# Patient Record
Sex: Female | Born: 1990 | Race: Black or African American | Hispanic: No | Marital: Single | Smoking: Former smoker
Health system: Southern US, Community
[De-identification: ages and names within clinical notes are randomized; demographics above are authoritative.]

## PROBLEM LIST (undated history)

## (undated) DIAGNOSIS — Z789 Other specified health status: Secondary | ICD-10-CM

## (undated) HISTORY — DX: Other specified health status: Z78.9

---
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
## 2010-05-19 ENCOUNTER — Inpatient Hospital Stay (HOSPITAL_COMMUNITY): Admission: AD | Admit: 2010-05-19 | Discharge: 2010-05-19 | Payer: Self-pay | Admitting: Obstetrics & Gynecology

## 2010-06-07 ENCOUNTER — Ambulatory Visit (HOSPITAL_COMMUNITY): Admission: RE | Admit: 2010-06-07 | Discharge: 2010-06-07 | Payer: Self-pay | Admitting: Family Medicine

## 2010-06-21 ENCOUNTER — Ambulatory Visit (HOSPITAL_COMMUNITY): Admission: RE | Admit: 2010-06-21 | Discharge: 2010-06-21 | Payer: Self-pay | Admitting: Family Medicine

## 2010-08-02 ENCOUNTER — Ambulatory Visit: Payer: Self-pay | Admitting: Advanced Practice Midwife

## 2010-08-04 ENCOUNTER — Inpatient Hospital Stay (HOSPITAL_COMMUNITY): Admission: AD | Admit: 2010-08-04 | Discharge: 2010-08-04 | Payer: Self-pay | Admitting: Obstetrics & Gynecology

## 2010-08-04 ENCOUNTER — Inpatient Hospital Stay (HOSPITAL_COMMUNITY): Admission: AD | Admit: 2010-08-04 | Discharge: 2010-08-06 | Payer: Self-pay | Admitting: Obstetrics & Gynecology

## 2010-08-04 ENCOUNTER — Ambulatory Visit: Payer: Self-pay | Admitting: Obstetrics and Gynecology

## 2010-11-28 ENCOUNTER — Inpatient Hospital Stay (HOSPITAL_COMMUNITY): Admission: AD | Admit: 2010-11-28 | Discharge: 2010-08-02 | Payer: Self-pay | Admitting: Obstetrics and Gynecology

## 2011-03-07 LAB — CBC
Hemoglobin: 12.9 g/dL (ref 12.0–15.0)
MCH: 30.7 pg (ref 26.0–34.0)
MCHC: 33.8 g/dL (ref 30.0–36.0)
MCV: 90.7 fL (ref 78.0–100.0)
RBC: 4.21 MIL/uL (ref 3.87–5.11)
WBC: 16.4 10*3/uL — ABNORMAL HIGH (ref 4.0–10.5)

## 2011-03-07 LAB — RPR: RPR Ser Ql: NONREACTIVE

## 2011-03-10 LAB — GC/CHLAMYDIA PROBE AMP, GENITAL
Chlamydia, DNA Probe: NEGATIVE
GC Probe Amp, Genital: NEGATIVE

## 2011-03-10 LAB — URINALYSIS, ROUTINE W REFLEX MICROSCOPIC
Ketones, ur: NEGATIVE mg/dL
Nitrite: NEGATIVE
Protein, ur: NEGATIVE mg/dL

## 2011-03-10 LAB — WET PREP, GENITAL

## 2011-03-10 LAB — URINE CULTURE

## 2011-03-10 LAB — RAPID URINE DRUG SCREEN, HOSP PERFORMED: Cocaine: NOT DETECTED

## 2011-03-10 LAB — FETAL FIBRONECTIN: Fetal Fibronectin: POSITIVE — AB

## 2017-06-19 ENCOUNTER — Encounter (HOSPITAL_COMMUNITY): Payer: Self-pay | Admitting: Emergency Medicine

## 2017-06-19 ENCOUNTER — Ambulatory Visit (HOSPITAL_COMMUNITY)
Admission: EM | Admit: 2017-06-19 | Discharge: 2017-06-19 | Disposition: A | Discharge status: Home / Self Care | Payer: Self-pay | Attending: Family Medicine | Admitting: Family Medicine

## 2017-06-19 DIAGNOSIS — L249 Irritant contact dermatitis, unspecified cause: Secondary | ICD-10-CM

## 2017-06-19 MED ORDER — HYDROXYZINE HCL 25 MG PO TABS: 25.0000 mg | ORAL_TABLET | Freq: Four times a day (QID) | ORAL | 0 refills | Status: DC

## 2017-06-19 MED ORDER — PREDNISONE 5 MG PO TABS: ORAL_TABLET | ORAL | 0 refills | Status: DC

## 2017-06-19 NOTE — ED Provider Notes (Signed)
CSN: [id number]     Arrival date & time 06/19/17  1005 History   None    Chief Complaint  Patient presents with  . Rash   (Consider location/radiation/quality/duration/timing/severity/associated sxs/prior Treatment) Patient has rash on face that is itchy and red.  She is unaware of any irritants or allergens.   The history is provided by the patient.  Rash  Location:  Face Facial rash location:  Face Quality: itchiness and redness   Severity:  Mild Onset quality:  Sudden Duration:  2 days Timing:  Constant Progression:  Worsening Chronicity:  New Relieved by:  Nothing Worsened by:  Nothing Ineffective treatments:  None tried   History reviewed. No pertinent past medical history. History reviewed. No pertinent surgical history. History reviewed. No pertinent family history. Social History  Substance Use Topics  . Smoking status: Current Every Day Smoker    Packs/day: 1.00    Types: Cigarettes  . Smokeless tobacco: Never Used  . Alcohol use Yes   OB History    No data available     Review of Systems  Constitutional: Negative.   HENT: Negative.   Eyes: Negative.   Respiratory: Negative.   Cardiovascular: Negative.   Gastrointestinal: Negative.   Endocrine: Negative.   Genitourinary: Negative.   Musculoskeletal: Negative.   Skin: Positive for rash.  Allergic/Immunologic: Negative.   Neurological: Negative.   Hematological: Negative.   Psychiatric/Behavioral: Negative.     Allergies  Patient has no known allergies.  Home Medications   Prior to Admission medications   Medication Sig Start Date End Date Taking? Authorizing Provider  hydrOXYzine (ATARAX/VISTARIL) 25 MG tablet Take 1 tablet (25 mg total) by mouth every 6 (six) hours. 06/19/17   Deatra Canterxford, Upton Russey J, FNP  predniSONE (DELTASONE) 5 MG tablet Take 6-5-4-3-2-1 po qd 06/19/17   Deatra Canterxford, Donnelle Olmeda J, FNP   Meds Ordered and Administered this Visit  Medications - No data to display  BP 104/68 (BP  Location: Right Arm)   Pulse 64   Temp 98.4 F (36.9 C) (Oral)   Resp 16   LMP 06/12/2017   SpO2 98%  No data found.   Physical Exam  Constitutional: She appears well-developed and well-nourished.  HENT:  Head: Normocephalic and atraumatic.  Eyes: Conjunctivae and EOM are normal. Pupils are equal, round, and reactive to light.  Neck: Normal range of motion. Neck supple.  Cardiovascular: Normal rate, regular rhythm and normal heart sounds.   Pulmonary/Chest: Effort normal and breath sounds normal.  Skin: Rash noted.  Erythematous itchy rash on cheeks, nose, forehead.  Nursing note and vitals reviewed.   Urgent Care Course     Procedures (including critical care time)  Labs Review Labs Reviewed - No data to display  Imaging Review No results found.   Visual Acuity Review  Right Eye Distance:   Left Eye Distance:   Bilateral Distance:    Right Eye Near:   Left Eye Near:    Bilateral Near:         MDM   1. Irritant contact dermatitis, unspecified trigger    Prednisone 5mg  take 6-5-4-3-2-1 po qd #21 Hydroxyzine 25mg  one po q 6 hours prn #24      Deatra CanterOxford, Irlene Crudup J, FNP 06/19/17 1114

## 2017-06-19 NOTE — ED Triage Notes (Signed)
Pt c/o rash on face since yest   Voices no other concerns  Taking benadryl w/no relief  A&O x4... NAD... Ambulatory

## 2017-06-19 NOTE — ED Notes (Signed)
Bed: UC01 Expected date:  Expected time:  Means of arrival:  Comments: Closed 

## 2018-01-16 ENCOUNTER — Other Ambulatory Visit: Payer: Self-pay

## 2018-01-16 ENCOUNTER — Emergency Department
Admission: EM | Admit: 2018-01-16 | Discharge: 2018-01-16 | Disposition: A | Discharge status: Home / Self Care | Payer: Self-pay | Attending: Emergency Medicine | Admitting: Emergency Medicine

## 2018-01-16 ENCOUNTER — Encounter: Payer: Self-pay | Admitting: Emergency Medicine

## 2018-01-16 DIAGNOSIS — H9201 Otalgia, right ear: Secondary | ICD-10-CM | POA: Insufficient documentation

## 2018-01-16 DIAGNOSIS — F17210 Nicotine dependence, cigarettes, uncomplicated: Secondary | ICD-10-CM | POA: Insufficient documentation

## 2018-01-16 DIAGNOSIS — J029 Acute pharyngitis, unspecified: Secondary | ICD-10-CM | POA: Insufficient documentation

## 2018-01-16 LAB — GROUP A STREP BY PCR: Group A Strep by PCR: NOT DETECTED

## 2018-01-16 MED ORDER — CETIRIZINE HCL 10 MG PO CAPS: 10.0000 mg | ORAL_CAPSULE | Freq: Every day | ORAL | 3 refills | Status: AC

## 2018-01-16 MED ORDER — MAGIC MOUTHWASH W/LIDOCAINE: 10.0000 mL | Freq: Four times a day (QID) | ORAL | 0 refills | Status: DC | PRN

## 2018-01-16 NOTE — ED Notes (Signed)
NAD noted at time of D/C. Pt denies questions or concerns. Pt ambulatory to the lobby at this time.  

## 2018-01-16 NOTE — ED Provider Notes (Signed)
[facility] Emergency Department Provider Note  ____________________________________________  Time seen: Approximately 10:34 PM  I have reviewed the triage vital signs and the nursing notes.   HISTORY  Chief Complaint Sore Throat    HPI Deborah Bishop is a 27 y.o. female who presents to the emergency department for evaluation and treatment of sore throat.  Symptoms started yesterday.  She is also had some right ear pain.  She denies fever or cough.  She has eaten some cough drops, but otherwise has not attempted any alleviating measures for this complaint.  History reviewed. No pertinent past medical history.  There are no active problems to display for this patient.   History reviewed. No pertinent surgical history.  Prior to Admission medications   Medication Sig Start Date End Date Taking? Authorizing Provider  Cetirizine HCl 10 MG CAPS Take 1 capsule (10 mg total) by mouth daily. 01/16/18   Izaih Kataoka, Rulon Eisenmengerari B, FNP  hydrOXYzine (ATARAX/VISTARIL) 25 MG tablet Take 1 tablet (25 mg total) by mouth every 6 (six) hours. 06/19/17   Deatra Canterxford, William J, FNP  magic mouthwash w/lidocaine SOLN Take 10 mLs by mouth 4 (four) times daily as needed for mouth pain. 30ml diphenhydramine; 30ml Maalox; 20ml Lidocaine 01/16/18   Jaimi Belle, Rulon Eisenmengerari B, FNP  predniSONE (DELTASONE) 5 MG tablet Take 6-5-4-3-2-1 po qd 06/19/17   Deatra Canterxford, William J, FNP    Allergies Patient has no known allergies.  History reviewed. No pertinent family history.  Social History Social History   Tobacco Use  . Smoking status: Current Every Day Smoker    Packs/day: 1.00    Types: Cigarettes  . Smokeless tobacco: Never Used  Substance Use Topics  . Alcohol use: Yes  . Drug use: No    Review of Systems Constitutional: Negative for fever. Eyes: No visual changes. ENT: Positive for sore throat; negative for difficulty swallowing. Positive for right otalgia. Respiratory: Denies shortness of  breath. Gastrointestinal: No abdominal pain.  No nausea, no vomiting.  No diarrhea.  Genitourinary: Negative for dysuria. Musculoskeletal: Negative for generalized body aches. Skin: Negative for rash. Neurological: Negative for headaches, no  focal weakness or numbness.  ____________________________________________   PHYSICAL EXAM:  VITAL SIGNS: ED Triage Vitals [01/16/18 2023]  Enc Vitals Group     BP 109/70     Pulse Rate 100     Resp 16     Temp 98.6 F (37 C)     Temp Source Oral     SpO2 100 %     Weight 140 lb (63.5 kg)     Height 5\' 2"  (1.575 m)     Head Circumference      Peak Flow      Pain Score 10     Pain Loc      Pain Edu?      Excl. in GC?    Constitutional: Alert and oriented. Well appearing and in no acute distress. Eyes: Conjunctivae are normal.  Head: Atraumatic. Nose: No congestion/rhinnorhea. Mouth/Throat: Mucous membranes are moist.  Oropharynx erythematous, tonsils flat without exudate. Uvula is midline. Neck: No stridor.  Lymphatic: Lymphadenopathy: Bilateral anterior cervical lymphadenopathy. Cardiovascular: Normal rate, regular rhythm. Good peripheral circulation. Respiratory: Normal respiratory effort. Lungs CTAB. Gastrointestinal: Soft and nontender. Musculoskeletal: No lower extremity tenderness nor edema.  Neurologic:  Normal speech and language. No gross focal neurologic deficits are appreciated. Speech is normal. No gait instability. Skin:  Skin is warm, dry and intact. No rash noted Psychiatric: Mood and affect are  normal. Speech and behavior are normal.  ____________________________________________   LABS (all labs ordered are listed, but only abnormal results are displayed)  Labs Reviewed  GROUP A STREP BY PCR   ____________________________________________  EKG  Not indicated. ____________________________________________  RADIOLOGY  Not  indicated. ____________________________________________   PROCEDURES  Procedure(s) performed: None  Critical Care performed: No ____________________________________________   INITIAL IMPRESSION / ASSESSMENT AND PLAN / ED COURSE  27 year old female presenting to the emergency department for evaluation of sore throat.  Strep screen is negative.  She will be treated with Magic mouthwash and cetirizine.  She was encouraged to establish primary care and follow-up if not improving over the next few days.  She was instructed to return to the emergency department for symptoms of change or worsen if she is unable to schedule an appointment.  Pertinent labs & imaging results that were available during my care of the patient were reviewed by me and considered in my medical decision making (see chart for details). ____________________________________________  Discharge Medication List as of 01/16/2018  9:41 PM    START taking these medications   Details  Cetirizine HCl 10 MG CAPS Take 1 capsule (10 mg total) by mouth daily., Starting Sat 01/16/2018, Print    magic mouthwash w/lidocaine SOLN Take 10 mLs by mouth 4 (four) times daily as needed for mouth pain. 30ml diphenhydramine; 30ml Maalox; 20ml Lidocaine, Starting Sat 01/16/2018, Print        FINAL CLINICAL IMPRESSION(S) / ED DIAGNOSES  Final diagnoses:  Viral pharyngitis    If controlled substance prescribed during this visit, 12 month history viewed on the [facility] prior to issuing an initial prescription for Schedule II or III opiod.   Note:  This document was prepared using Dragon voice recognition software and may include unintentional dictation errors.    Chinita Pester, FNP 01/16/18 2239    Sharman Cheek, MD 01/16/18 2352

## 2018-01-16 NOTE — ED Triage Notes (Signed)
Pt states sore throat today. Pt appears in no acute distress, denies other symptoms than right sided ear pain with sore throat.

## 2020-01-25 ENCOUNTER — Encounter (HOSPITAL_COMMUNITY): Payer: Self-pay

## 2020-01-25 ENCOUNTER — Ambulatory Visit (HOSPITAL_COMMUNITY)
Admission: EM | Admit: 2020-01-25 | Discharge: 2020-01-25 | Disposition: A | Discharge status: Home / Self Care | Payer: Self-pay

## 2020-01-25 ENCOUNTER — Other Ambulatory Visit: Payer: Self-pay

## 2020-01-25 DIAGNOSIS — H6123 Impacted cerumen, bilateral: Secondary | ICD-10-CM

## 2020-01-25 NOTE — Discharge Instructions (Addendum)
We cleaned both of the ears out.  If this problem reoccurs you can try debrox OTC to soften the ear wax.  Follow up as needed for continued or worsening symptoms

## 2020-01-25 NOTE — ED Triage Notes (Signed)
Pt state she has ear pain since Monday. ( both ) ears

## 2020-01-26 NOTE — ED Provider Notes (Signed)
[facility]    CSN: [id number] Arrival date & time: 01/25/20  7829      History   Chief Complaint Chief Complaint  Patient presents with  . Otalgia    HPI Deborah Bishop is a 29 y.o. female.   Patient is a 29 year old female who presents today with bilateral ear fullness, decreased hearing.  Symptoms have been constant for the past 3 to 4 days.  Does not have any specific pain in the ears.  Denies any associated nasal congestion, rhinorrhea, cough, fevers.  Deborah Bishop has not done anything for her symptoms.  No foreign bodies or injuries to the ear .  ROS per HPI      History reviewed. No pertinent past medical history.  There are no problems to display for this patient.   History reviewed. No pertinent surgical history.  OB History   No obstetric history on file.      Home Medications    Prior to Admission medications   Medication Sig Start Date End Date Taking? Authorizing Provider  Cetirizine HCl 10 MG CAPS Take 1 capsule (10 mg total) by mouth daily. 01/16/18   Triplett, Rulon Eisenmenger B, FNP  hydrOXYzine (ATARAX/VISTARIL) 25 MG tablet Take 1 tablet (25 mg total) by mouth every 6 (six) hours. 06/19/17   Deatra Canter, FNP  magic mouthwash w/lidocaine SOLN Take 10 mLs by mouth 4 (four) times daily as needed for mouth pain. 79ml diphenhydramine; 6ml Maalox; 22ml Lidocaine 01/16/18   Kem Boroughs B, FNP  predniSONE (DELTASONE) 5 MG tablet Take 6-5-4-3-2-1 po qd 06/19/17   Deatra Canter, FNP    Family History History reviewed. No pertinent family history.  Social History Social History   Tobacco Use  . Smoking status: Current Every Day Smoker    Packs/day: 1.00    Types: Cigarettes  . Smokeless tobacco: Never Used  Substance Use Topics  . Alcohol use: Yes  . Drug use: No     Allergies   Patient has no known allergies.   Review of Systems Review of Systems   Physical Exam Triage Vital Signs ED Triage Vitals  Enc Vitals Group     BP 01/25/20  0853 116/72     Pulse Rate 01/25/20 0853 75     Resp 01/25/20 0853 16     Temp 01/25/20 0853 98.7 F (37.1 C)     Temp Source 01/25/20 0853 Oral     SpO2 01/25/20 0853 100 %     Weight 01/25/20 0851 160 lb 3.2 oz (72.7 kg)     Height --      Head Circumference --      Peak Flow --      Pain Score 01/25/20 0851 8     Pain Loc --      Pain Edu? --      Excl. in GC? --    No data found.  Updated Vital Signs BP 116/72 (BP Location: Right Arm)   Pulse 75   Temp 98.7 F (37.1 C) (Oral)   Resp 16   Wt 160 lb 3.2 oz (72.7 kg)   LMP 12/26/2019   SpO2 100%   BMI 29.30 kg/m   Visual Acuity Right Eye Distance:   Left Eye Distance:   Bilateral Distance:    Right Eye Near:   Left Eye Near:    Bilateral Near:     Physical Exam Vitals and nursing note reviewed.  Constitutional:      General: Deborah Bishop is  not in acute distress.    Appearance: Normal appearance. Deborah Bishop is not ill-appearing, toxic-appearing or diaphoretic.  HENT:     Head: Normocephalic.     Right Ear: There is impacted cerumen.     Left Ear: There is impacted cerumen.     Nose: Nose normal.  Eyes:     Conjunctiva/sclera: Conjunctivae normal.  Pulmonary:     Effort: Pulmonary effort is normal.  Musculoskeletal:        General: Normal range of motion.     Cervical back: Normal range of motion.  Skin:    General: Skin is warm and dry.     Findings: No rash.  Neurological:     Mental Status: Deborah Bishop is alert.  Psychiatric:        Mood and Affect: Mood normal.      UC Treatments / Results  Labs (all labs ordered are listed, but only abnormal results are displayed) Labs Reviewed - No data to display  EKG   Radiology No results found.  Procedures Procedures (including critical care time)  Medications Ordered in UC Medications - No data to display  Initial Impression / Assessment and Plan / UC Course  I have reviewed the triage vital signs and the nursing notes.  Pertinent labs & imaging results that  were available during my care of the patient were reviewed by me and considered in my medical decision making (see chart for details).     Bilateral cerumen impaction-ear wash done here in clinic with removal wax in both ears. Patient feels much better Recommended Debrox for future issues. No concern for infection at this time. Follow up as needed for continued or worsening symptoms  Final Clinical Impressions(s) / UC Diagnoses   Final diagnoses:  Bilateral impacted cerumen     Discharge Instructions     We cleaned both of the ears out.  If this problem reoccurs you can try debrox OTC to soften the ear wax.  Follow up as needed for continued or worsening symptoms     ED Prescriptions    None     PDMP not reviewed this encounter.   Loura Halt A, NP 01/26/20 [phone]

## 2020-05-11 ENCOUNTER — Encounter (HOSPITAL_COMMUNITY): Payer: Self-pay

## 2020-05-11 ENCOUNTER — Ambulatory Visit (HOSPITAL_COMMUNITY)
Admission: EM | Admit: 2020-05-11 | Discharge: 2020-05-11 | Disposition: A | Discharge status: Home / Self Care | Payer: BC Managed Care – PPO | Attending: Physician Assistant | Admitting: Physician Assistant

## 2020-05-11 ENCOUNTER — Other Ambulatory Visit: Payer: Self-pay

## 2020-05-11 DIAGNOSIS — S8992XA Unspecified injury of left lower leg, initial encounter: Secondary | ICD-10-CM

## 2020-05-11 DIAGNOSIS — M25562 Pain in left knee: Secondary | ICD-10-CM | POA: Diagnosis not present

## 2020-05-11 DIAGNOSIS — M25532 Pain in left wrist: Secondary | ICD-10-CM | POA: Diagnosis not present

## 2020-05-11 MED ORDER — IBUPROFEN 800 MG PO TABS: 800.0000 mg | ORAL_TABLET | Freq: Three times a day (TID) | ORAL | 0 refills | Status: DC

## 2020-05-11 NOTE — ED Provider Notes (Signed)
[facility]    CSN: [id number] Arrival date & time: 05/11/20  1819      History   Chief Complaint Chief Complaint  Patient presents with  . Wrist Pain  . Knee Pain    HPI Deborah Bishop is a 29 y.o. female.   Patient reports urgent care with 1 week history of left wrist pain and left knee pain.  She reports she has been in a self-defense class with work throughout the week.  She reports her wrist started bothering her on Monday.  She reports it started as a small twinge in the outside of the wrist.  She reports this is gotten a little worse throughout the week.  She reports she was doing a lot of planks when it started to bother her.  She reports it hurts to move the wrist at times.  Denies any numbness or tingling in the wrist.  Denies falling on the wrist.  She also reports left knee pain after kicking a bag and feeling a crack.  She reports immediately after feeling the pain and crack was difficult to stand on the knee.  She attempted twice and was successful in the third time.  She reports after this the knee hurt but was able to walk with a limp.  She reports since then she has had aching pain with some sharp shooting pain through the knee.  There was some swelling that is since gone down somewhat.  She has been taking it easy since then.  Denies previous injury to the knee     History reviewed. No pertinent past medical history.  There are no problems to display for this patient.   History reviewed. No pertinent surgical history.  OB History   No obstetric history on file.      Home Medications    Prior to Admission medications   Medication Sig Start Date End Date Taking? Authorizing Provider  Cetirizine HCl 10 MG CAPS Take 1 capsule (10 mg total) by mouth daily. 01/16/18  Yes Triplett, Cari B, FNP  hydrOXYzine (ATARAX/VISTARIL) 25 MG tablet Take 1 tablet (25 mg total) by mouth every 6 (six) hours. 06/19/17   Deatra Canter, FNP  ibuprofen (ADVIL) 800 MG  tablet Take 1 tablet (800 mg total) by mouth 3 (three) times daily. 05/11/20   Jahsir Rama, Veryl Speak, PA-C  magic mouthwash w/lidocaine SOLN Take 10 mLs by mouth 4 (four) times daily as needed for mouth pain. 49ml diphenhydramine; 21ml Maalox; 39ml Lidocaine 01/16/18   Kem Boroughs B, FNP  predniSONE (DELTASONE) 5 MG tablet Take 6-5-4-3-2-1 po qd 06/19/17   Deatra Canter, FNP    Family History History reviewed. No pertinent family history.  Social History Social History   Tobacco Use  . Smoking status: Current Every Day Smoker    Packs/day: 1.00    Types: Cigarettes  . Smokeless tobacco: Never Used  Substance Use Topics  . Alcohol use: Yes  . Drug use: No     Allergies   Patient has no known allergies.   Review of Systems Review of Systems   Physical Exam Triage Vital Signs ED Triage Vitals  Enc Vitals Group     BP 05/11/20 1916 105/70     Pulse Rate 05/11/20 1916 68     Resp 05/11/20 1916 18     Temp 05/11/20 1916 98.5 F (36.9 C)     Temp Source 05/11/20 1916 Oral     SpO2 05/11/20 1916 99 %  Weight --      Height --      Head Circumference --      Peak Flow --      Pain Score 05/11/20 1913 9     Pain Loc --      Pain Edu? --      Excl. in [place]? --    No data found.  Updated Vital Signs BP 105/70 (BP Location: Left Arm)   Pulse 68   Temp 98.5 F (36.9 C) (Oral)   Resp 18   LMP 04/25/2020   SpO2 99%   Visual Acuity Right Eye Distance:   Left Eye Distance:   Bilateral Distance:    Right Eye Near:   Left Eye Near:    Bilateral Near:     Physical Exam Vitals and nursing note reviewed.  Constitutional:      General: She is not in acute distress.    Appearance: She is well-developed.  HENT:     Head: Normocephalic and atraumatic.  Eyes:     Conjunctiva/sclera: Conjunctivae normal.  Cardiovascular:     Rate and Rhythm: Normal rate and regular rhythm.     Heart sounds: No murmur.  Pulmonary:     Effort: Pulmonary effort is normal. No  respiratory distress.     Breath sounds: Normal breath sounds.  Abdominal:     Palpations: Abdomen is soft.     Tenderness: There is no abdominal tenderness.  Musculoskeletal:     Right wrist: Normal.     Left wrist: Effusion and tenderness present. No swelling, deformity, bony tenderness, snuff box tenderness or crepitus. Decreased range of motion.     Cervical back: Neck supple.     Right knee: Normal.     Left knee: Effusion and bony tenderness present. No erythema or ecchymosis. Decreased range of motion.     Comments: There is tenderness at the medial and lateral joint lines.  There is pain elicited with valgus stress.  Anterior drawer and posterior drawer stable.  Skin:    General: Skin is warm and dry.  Neurological:     Mental Status: She is alert.      UC Treatments / Results  Labs (all labs ordered are listed, but only abnormal results are displayed) Labs Reviewed - No data to display  EKG   Radiology No results found.  Procedures Procedures (including critical care time)  Medications Ordered in UC Medications - No data to display  Initial Impression / Assessment and Plan / UC Course  I have reviewed the triage vital signs and the nursing notes.  Pertinent labs & imaging results that were available during my care of the patient were reviewed by me and considered in my medical decision making (see chart for details).     #Left knee injury #Left wrist pain Patient is 29 year old with acute injury to left knee.  Given exam suspect possible MCL injury or other ligamentous injury.  Suspect wrist is mild sprain/tendinitis.  Will place in knee brace and and crutches.  NSAID therapy with ice and rest.  We will have her follow-up with orthopedics for reevaluation of the knee.  Patient verbalized understanding plan. Final Clinical Impressions(s) / UC Diagnoses   Final diagnoses:  Injury of left knee, initial encounter  Left wrist pain  Acute pain of left knee      Discharge Instructions     I suspect you may have torn or injured a ligament in your knee  Wear the knee immobilizer  and use the crutches until you have had orthopedic evaluation  Take the ibuprofen and ice the knee.  This will also help with the wrist. You may also ice the wrist        ED Prescriptions    Medication Sig Dispense Auth. Provider   ibuprofen (ADVIL) 800 MG tablet Take 1 tablet (800 mg total) by mouth 3 (three) times daily. 21 tablet Denina Rieger, Veryl Speak, PA-C     PDMP not reviewed this encounter.   Hermelinda Medicus, PA-C 05/12/20 1021

## 2020-05-11 NOTE — ED Triage Notes (Signed)
Pt states she awoke Tuesday with left wrist pain. Denies injury/trauma to area.  Pt reports states while doing self-defense class, she heard a "crack" to her left knee when she kicked a high mat. Pt c/o pain left knee in general.  Has not taken any pain relievers/NSAIDs.

## 2020-05-11 NOTE — Discharge Instructions (Addendum)
I suspect you may have torn or injured a ligament in your knee  Wear the knee immobilizer and use the crutches until you have had orthopedic evaluation  Take the ibuprofen and ice the knee.  This will also help with the wrist. You may also ice the wrist

## 2021-02-19 HISTORY — PX: WISDOM TOOTH EXTRACTION: SHX21

## 2021-06-28 ENCOUNTER — Other Ambulatory Visit: Payer: Self-pay

## 2021-06-28 ENCOUNTER — Ambulatory Visit (INDEPENDENT_AMBULATORY_CARE_PROVIDER_SITE_OTHER): Payer: 59

## 2021-06-28 VITALS — BP 108/71 | HR 66 | Ht 62.0 in | Wt 132.0 lb

## 2021-06-28 DIAGNOSIS — Z3201 Encounter for pregnancy test, result positive: Secondary | ICD-10-CM

## 2021-06-28 DIAGNOSIS — Z3480 Encounter for supervision of other normal pregnancy, unspecified trimester: Secondary | ICD-10-CM | POA: Diagnosis not present

## 2021-06-28 LAB — POCT URINE PREGNANCY: Preg Test, Ur: POSITIVE — AB

## 2021-06-28 NOTE — Progress Notes (Addendum)
Deborah Bishop presents today for UPT. She has no unusual complaints.  LMP: 05/16/2021    OBJECTIVE: Appears well, in no apparent distress.  OB History     Gravida  2   Para  1   Term  1   Preterm      AB      Living  1      SAB      IAB      Ectopic      Multiple      Live Births  1          Home UPT Result:POSITIVE In-Office UPT result:POSITIVE  I have reviewed the patient's medical, obstetrical, social, and family histories, and medications.   ASSESSMENT: Positive pregnancy test LMP 05/16/2021 EDD 02/20/2022 [place]    [redacted]w[redacted]d  PLAN Prenatal care to be completed at: [facility]      Chart reviewed for nurse visit. Agree with plan of care.   Currie Paris, NP 06/28/2021 12:41 PM

## 2021-07-18 ENCOUNTER — Ambulatory Visit (INDEPENDENT_AMBULATORY_CARE_PROVIDER_SITE_OTHER): Payer: 59

## 2021-07-18 ENCOUNTER — Ambulatory Visit: Payer: 59

## 2021-07-18 ENCOUNTER — Other Ambulatory Visit: Payer: Self-pay

## 2021-07-18 VITALS — BP 96/58 | HR 60 | Ht 62.0 in | Wt 127.3 lb

## 2021-07-18 DIAGNOSIS — O3680X0 Pregnancy with inconclusive fetal viability, not applicable or unspecified: Secondary | ICD-10-CM

## 2021-07-18 DIAGNOSIS — Z3480 Encounter for supervision of other normal pregnancy, unspecified trimester: Secondary | ICD-10-CM

## 2021-07-18 DIAGNOSIS — Z3481 Encounter for supervision of other normal pregnancy, first trimester: Secondary | ICD-10-CM

## 2021-07-18 DIAGNOSIS — Z3491 Encounter for supervision of normal pregnancy, unspecified, first trimester: Secondary | ICD-10-CM

## 2021-07-18 DIAGNOSIS — Z3A08 8 weeks gestation of pregnancy: Secondary | ICD-10-CM

## 2021-07-18 MED ORDER — VITAFOL GUMMIES 3.33-0.333-34.8 MG PO CHEW: 3.0000 | CHEWABLE_TABLET | Freq: Every day | ORAL | 11 refills | Status: DC

## 2021-07-18 MED ORDER — DOXYLAMINE-PYRIDOXINE 10-10 MG PO TBEC: 2.0000 | DELAYED_RELEASE_TABLET | Freq: Every day | ORAL | 5 refills | Status: DC

## 2021-07-18 NOTE — Progress Notes (Signed)
New OB Intake  I connected with  Deborah Bishop on 07/18/21 at 10:15 AM EDT by in person Video Visit and verified that I am speaking with the correct person using two identifiers. Nurse is located at [facility] and pt is located at [place].  I discussed the limitations, risks, security and privacy concerns of performing an evaluation and management service by telephone and the availability of in person appointments. I also discussed with the patient that there may be a patient responsible charge related to this service. The patient expressed understanding and agreed to proceed.  I explained I am completing New OB Intake today. We discussed her EDD is undetermined at this time due to early gestation.Pt is G2/P1001. I reviewed her allergies, medications, Medical/Surgical/OB history, and appropriate screenings. I informed her of [facility] services. Based on history, this is a/an  pregnancy uncomplicated .   Patient Active Problem List   Diagnosis Date Noted   Supervision of other normal pregnancy, antepartum 06/28/2021    Concerns addressed today  Delivery Plans:  Plans to deliver at [facility].   MyChart/Babyscripts MyChart access verified. I explained pt will have some visits in office and some virtually. Babyscripts instructions given and order placed. Patient verifies receipt of registration text/e-mail. Account successfully created and app downloaded.  Blood Pressure Cuff  Patient has private insurance; instructed to purchase blood pressure cuff and bring to first prenatal appt. Explained after first prenatal appt pt will check weekly and document in Babyscripts.  Weight scale: Patient will purchase a weight scale.  Anatomy US Explained first scheduled [place] will be around 19 weeks. Dating and viability scan performed today.  Labs Discussed Avelina Laine genetic screening with patient. Would like both Panorama and Horizon drawn at new OB visit. Routine prenatal labs needed.  Covid Vaccine Patient has  covid vaccine.   Mother/ Baby Dyad Candidate?    If yes, offer as possibility  Informed patient of [facility] website  and placed link in her AVS.   Social Determinants of Health Food Insecurity: Patient denies food insecurity. WIC Referral: Patient is interested in referral to [facility].  Transportation: Patient denies transportation needs. Childcare: Discussed no children allowed at ultrasound appointments. Offered childcare services; patient declines childcare services at this time.   Placed OB Box on problem list and updated  First visit review I reviewed new OB appt with pt. I explained she will have a pelvic exam, ob bloodwork with genetic screening, and PAP smear. Explained pt will be seen by [name] at first visit; encounter routed to appropriate provider. Explained that patient will be seen by pregnancy navigator following visit with provider. [facility] information placed in AVS.   Hamilton Capri, RN 07/18/2021  10:11 AM

## 2021-08-01 ENCOUNTER — Other Ambulatory Visit: Payer: Self-pay

## 2021-08-01 ENCOUNTER — Ambulatory Visit (INDEPENDENT_AMBULATORY_CARE_PROVIDER_SITE_OTHER): Payer: 59 | Admitting: Obstetrics & Gynecology

## 2021-08-01 ENCOUNTER — Ambulatory Visit
Admission: RE | Admit: 2021-08-01 | Discharge: 2021-08-01 | Disposition: A | Discharge status: Home / Self Care | Payer: 59 | Source: Ambulatory Visit | Attending: Obstetrics & Gynecology | Admitting: Obstetrics & Gynecology

## 2021-08-01 DIAGNOSIS — O3680X0 Pregnancy with inconclusive fetal viability, not applicable or unspecified: Secondary | ICD-10-CM | POA: Diagnosis not present

## 2021-08-01 DIAGNOSIS — Z3491 Encounter for supervision of normal pregnancy, unspecified, first trimester: Secondary | ICD-10-CM | POA: Diagnosis present

## 2021-08-01 DIAGNOSIS — O021 Missed abortion: Secondary | ICD-10-CM | POA: Diagnosis not present

## 2021-08-01 NOTE — Progress Notes (Signed)
Pt waiting in lobby. Results are not back. Pt advised to come back to [facility] at 1pm for [place] results with provider. No available provider at this time.   Judeth Cornfield, RN

## 2021-08-01 NOTE — Progress Notes (Signed)
Patient ID: Deborah Bishop, female   DOB: February 28, 1991, 30 y.o.   MRN: [id number] Ultrasounds Results Note  SUBJECTIVE HPI:  Deborah Bishop is a 30 y.o. G2P1001 at [redacted]w[redacted]d by LMP who presents [facility] for followup ultrasound results. The patient denies abdominal pain or vaginal bleeding.  ultrasound was performed earlier today.   No past medical history on file. Past Surgical History:  Procedure Laterality Date   WISDOM TOOTH EXTRACTION  02/2021   Social History   Socioeconomic History   Marital status: Single    Spouse name: Not on file   Number of children: Not on file   Years of education: Not on file   Highest education level: Not on file  Occupational History   Not on file  Tobacco Use   Smoking status: Every Day    Packs/day: 0.50    Types: Cigarettes   Smokeless tobacco: Never  Vaping Use   Vaping Use: Never used  Substance and Sexual Activity   Alcohol use: Not Currently    Comment: not since confirmed   Drug use: No   Sexual activity: Yes    Partners: Male    Birth control/protection: None    Comment: currently pregnanct  Other Topics Concern   Not on file  Social History Narrative   Not on file   Social Determinants of Health   Financial Resource Strain: Not on file  Food Insecurity: Not on file  Transportation Needs: Not on file  Physical Activity: Not on file  Stress: Not on file  Social Connections: Not on file  Intimate Partner Violence: Not on file   Current Outpatient Medications on File Prior to Visit  Medication Sig Dispense Refill   Cetirizine HCl 10 MG CAPS Take 1 capsule (10 mg total) by mouth daily. 30 capsule 3   Doxylamine-Pyridoxine (DICLEGIS) 10-10 MG TBEC Take 2 tablets by mouth at bedtime. If symptoms persist, add one tablet in the morning and one in the afternoon 100 tablet 5   Prenatal Vit-Fe Phos-FA-Omega (VITAFOL GUMMIES) 3.33-0.333-34.8 MG CHEW Chew 3 tablets by mouth daily. 90 tablet 11   No current  facility-administered medications on file prior to visit.   No Known Allergies  I have reviewed patient's Past Medical Hx, Surgical Hx, Family Hx, Social Hx, medications and allergies.   Review of Systems Review of Systems  Constitutional: Negative for fever and chills.  Gastrointestinal: Negative for nausea, vomiting, abdominal pain, diarrhea and constipation.  Genitourinary: Negative for dysuria.  Musculoskeletal: Negative for back pain.  Neurological: Negative for dizziness and weakness.    Physical Exam  LMP 05/16/2021 (Exact Date)   GENERAL: Well-developed, well-nourished female in no acute distress.  HEENT: Normocephalic, atraumatic.   LUNGS: Effort normal ABDOMEN: soft, non-tender HEART: Regular rate  SKIN: Warm, dry and without erythema PSYCH: Normal mood and affect NEURO: Alert and oriented x 4, very tearful  LAB RESULTS No results found for this or any previous visit (from the past 24 hour(s)).  IMAGING US OB Limited  Result Date: 07/21/2021 ----------------------------------------------------------------------  OBSTETRICS REPORT                        (Signed Final 07/21/2021 09:59 pm) ---------------------------------------------------------------------- Patient Info  ID #:       [id number]                          D.O.B.:  02/02/1991 (30 yrs)  Name:       Deborah Bishop                   Visit Date: 07/18/2021 11:09 am ---------------------------------------------------------------------- Performed By  Attending:        Scheryl Darter MD        Ref. Address:      [address]                                                              [place], [place]                                                              [address]  Performed By:     Angelica Pou RN         [facility]:          [facility]                                                              [facility]  Referred By:      Adam Phenix                    MD ---------------------------------------------------------------------- Orders  #  Description                           Code        Ordered By  1  US OB LIMITED                         35573.2     Scheryl Darter ----------------------------------------------------------------------  #  Order #  Accession #                Episode #  1  [id number]                   [id number]                 [id number] ---------------------------------------------------------------------- Indications  Weeks of gestation of pregnancy not             Z3A.00  specified  Encounter for uncertain dates                   Z36.87 ---------------------------------------------------------------------- Fetal Evaluation  Num Of Fetuses:          1  Cardiac Activity:        Not visualized ---------------------------------------------------------------------- Biometry  GS:       16.1  mm     G. Age:  6w 4d                   EDD:    03/09/22  CRL:       2.9  mm     G. Age:  5w 6d                   EDD:    03/14/22 ---------------------------------------------------------------------- Comments  IUP identified with two possible YS. One possible FP  identified at [redacted]w[redacted]d by CRL. Recommend reapeat scan in two  week to confirm dates and rule out twin pregnancy.  Technically limited exam due to early Gestational Age. ---------------------------------------------------------------------- Impression  IUP with possibly 2 gestational sac, only one fetal pole, 5w  6d, FHM not detected ---------------------------------------------------------------------- Recommendations  Recommend 2 week repeat scan to confirm viability and  possible twin pregnancy ----------------------------------------------------------------------                  Scheryl Darter, MD Electronically Signed Final Report   07/21/2021 09:59 pm  ----------------------------------------------------------------------  US OB LESS THAN 14 WEEKS WITH OB TRANSVAGINAL  Result Date: 08/01/2021 CLINICAL DATA:  Dating and viability question twin pregnancy EXAM: OBSTETRIC <14 WK [place] AND TRANSVAGINAL OB US TECHNIQUE: Both transabdominal and transvaginal ultrasound examinations were performed for complete evaluation of the gestation as well as the maternal uterus, adnexal regions, and pelvic cul-de-sac. Transvaginal technique was performed to assess early pregnancy. COMPARISON:  07/18/2021 FINDINGS: Intrauterine gestational sac: Present, single, slightly irregular Yolk sac:  Present Embryo:  Present Cardiac Activity: Not identified Heart Rate: N/A  bpm MSD: 17.7 mm   6 w   5 d CRL:  2.9 mm   5 w   6 d                  [place] EDC: 03/28/2022 Subchorionic hemorrhage:  None visualized. Maternal uterus/adnexae: Uterus retroverted, otherwise unremarkable. RIGHT ovary measures 3.5 x 3.1 x 5.9 cm and contains a 3.4 cm diameter corpus luteal cyst. LEFT ovary normal size and morphology 3.6 x 2.2 x 2.7 cm. No free pelvic fluid or adnexal masses. IMPRESSION: Single intrauterine gestation identified. No fetal cardiac activity identified. Absent growth of fetal pole since the prior exam 14 days ago. Findings are consistent with failed pregnancy. Electronically Signed   By: Ulyses Southward M.D.   On: 08/01/2021 12:39    ASSESSMENT 1. Missed abortion with fetal demise before 20 completed weeks of gestation     PLAN  She requested expectant management rather than receive Cytotec or consider surgical completion and will schedule short term follow-up Discharge  home in stable condition Adam PhenixArnold, Rakeem Colley G, MD  08/07/2021  6:56 AM

## 2021-08-02 ENCOUNTER — Encounter: Payer: 59 | Admitting: Obstetrics and Gynecology

## 2021-08-08 ENCOUNTER — Other Ambulatory Visit: Payer: Self-pay

## 2021-08-08 ENCOUNTER — Ambulatory Visit (INDEPENDENT_AMBULATORY_CARE_PROVIDER_SITE_OTHER): Payer: 59 | Admitting: Obstetrics

## 2021-08-08 ENCOUNTER — Encounter: Payer: Self-pay | Admitting: Obstetrics

## 2021-08-08 VITALS — BP 129/77 | HR 109 | Ht 62.0 in | Wt 128.0 lb

## 2021-08-08 DIAGNOSIS — F4321 Adjustment disorder with depressed mood: Secondary | ICD-10-CM | POA: Diagnosis not present

## 2021-08-08 DIAGNOSIS — O039 Complete or unspecified spontaneous abortion without complication: Secondary | ICD-10-CM

## 2021-08-08 NOTE — Progress Notes (Signed)
Patient ID: Deborah Bishop, female   DOB: 01/19/1991, 30 y.o.   MRN: [id number]  Chief Complaint  Patient presents with   Follow-up    HPI Deborah Bishop is a 30 y.o. female.  Presents for follow up after being diagnosed with a blighted ovum on ultrasound done on 08-01-2021.with no history of vaginal bleeding and cramping at the time.  She requested expectant management.  She has had some vaginal bleeding with clots and cramping over the past week with less vaginal bleeding and no cramping now. HPI  History reviewed. No pertinent past medical history.  Past Surgical History:  Procedure Laterality Date   WISDOM TOOTH EXTRACTION  02/2021    Family History  Problem Relation Age of Onset   Stroke Father    Hypertension Father    Graves' disease Father    Ovarian cancer Maternal Grandmother    Pancreatic cancer Maternal Grandfather    Heart failure Paternal Grandmother     Social History Social History   Tobacco Use   Smoking status: Every Day    Packs/day: 0.50    Types: Cigarettes   Smokeless tobacco: Never  Vaping Use   Vaping Use: Never used  Substance Use Topics   Alcohol use: Not Currently    Comment: not since confirmed   Drug use: No    No Known Allergies  Current Outpatient Medications  Medication Sig Dispense Refill   Cetirizine HCl 10 MG CAPS Take 1 capsule (10 mg total) by mouth daily. 30 capsule 3   Doxylamine-Pyridoxine (DICLEGIS) 10-10 MG TBEC Take 2 tablets by mouth at bedtime. If symptoms persist, add one tablet in the morning and one in the afternoon 100 tablet 5   Prenatal Vit-Fe Phos-FA-Omega (VITAFOL GUMMIES) 3.33-0.333-34.8 MG CHEW Chew 3 tablets by mouth daily. 90 tablet 11   No current facility-administered medications for this visit.    Review of Systems Review of Systems Constitutional: negative for fatigue and weight loss Respiratory: negative for cough and wheezing Cardiovascular: negative for chest pain, fatigue and  palpitations Gastrointestinal: negative for abdominal pain and change in bowel habits Genitourinary: positive for SAB, probabbly complete  Integument/breast: negative for nipple discharge Musculoskeletal:negative for myalgias Neurological: negative for gait problems and tremors Behavioral/Psych: negative for abusive relationship, depression Endocrine: negative for temperature intolerance      Blood pressure 129/77, pulse (!) 109, height 5\' 2"  (1.575 m), weight 128 lb (58.1 kg), last menstrual period 05/16/2021, unknown if currently breastfeeding.  Physical Exam Physical Exam General:   Alert and no distress  Skin:   no rash or abnormalities  Lungs:   clear to auscultation bilaterally  Heart:   regular rate and rhythm, S1, S2 normal, no murmur, click, rub or gallop  The remainder of the physical exam was deferred because of the type of encounter and patient with vaginal bleeding  I have spent a total of 15 minutes of face-to-face time, excluding clinical staff time, reviewing notes and preparing to see patient, ordering tests and/or medications, and counseling the patient.   Data Reviewed Beta-HCG  Assessment     1. SAB (spontaneous abortion), probably complete - continue expectant management  2. Grief associated with loss of fetus, appropriate - grief counseling offered and she states that she will consider counseling in the near future, but now just need privacy to grieve  - she will also need 3-4 weeks off work for her physical and mental health and recovery from the loss    Plan  Follow up in 2 weeks for an exam    Brock Bad, MD 08/08/2021 12:15 PM

## 2021-08-22 ENCOUNTER — Ambulatory Visit: Payer: 59 | Admitting: Obstetrics

## 2021-08-28 ENCOUNTER — Encounter: Payer: Self-pay | Admitting: Obstetrics

## 2021-08-28 ENCOUNTER — Ambulatory Visit (INDEPENDENT_AMBULATORY_CARE_PROVIDER_SITE_OTHER): Payer: 59 | Admitting: Obstetrics

## 2021-08-28 ENCOUNTER — Other Ambulatory Visit: Payer: Self-pay

## 2021-08-28 VITALS — BP 122/76 | HR 67 | Wt 130.0 lb

## 2021-08-28 DIAGNOSIS — F4321 Adjustment disorder with depressed mood: Secondary | ICD-10-CM | POA: Diagnosis not present

## 2021-08-28 DIAGNOSIS — O039 Complete or unspecified spontaneous abortion without complication: Secondary | ICD-10-CM

## 2021-08-28 NOTE — Progress Notes (Signed)
Patient ID: Deborah Bishop, female   DOB: 03-02-1991, 30 y.o.   MRN: [id number]  Chief Complaint  Patient presents with   Follow-up    HPI Deborah Bishop is a 30 y.o. female.  Presents for follow up ~ 4 weeks after a SAB.  No complaints.  Has scant spotting but no cramping.  Still appropriately grieving. HPI  History reviewed. No pertinent past medical history.  Past Surgical History:  Procedure Laterality Date   WISDOM TOOTH EXTRACTION  02/2021    Family History  Problem Relation Age of Onset   Stroke Father    Hypertension Father    Graves' disease Father    Ovarian cancer Maternal Grandmother    Pancreatic cancer Maternal Grandfather    Heart failure Paternal Grandmother     Social History Social History   Tobacco Use   Smoking status: Every Day    Packs/day: 0.50    Types: Cigarettes   Smokeless tobacco: Never  Vaping Use   Vaping Use: Never used  Substance Use Topics   Alcohol use: Not Currently    Comment: not since confirmed   Drug use: No    No Known Allergies  Current Outpatient Medications  Medication Sig Dispense Refill   Cetirizine HCl 10 MG CAPS Take 1 capsule (10 mg total) by mouth daily. 30 capsule 3   Doxylamine-Pyridoxine (DICLEGIS) 10-10 MG TBEC Take 2 tablets by mouth at bedtime. If symptoms persist, add one tablet in the morning and one in the afternoon 100 tablet 5   Prenatal Vit-Fe Phos-FA-Omega (VITAFOL GUMMIES) 3.33-0.333-34.8 MG CHEW Chew 3 tablets by mouth daily. 90 tablet 11   No current facility-administered medications for this visit.    Review of Systems Review of Systems Constitutional: negative for fatigue and weight loss Respiratory: negative for cough and wheezing Cardiovascular: negative for chest pain, fatigue and palpitations Gastrointestinal: negative for abdominal pain and change in bowel habits Genitourinary:negative Integument/breast: negative for nipple discharge Musculoskeletal:negative for myalgias Neurological:  negative for gait problems and tremors Behavioral/Psych: negative for abusive relationship, depression Endocrine: negative for temperature intolerance      Blood pressure 122/76, pulse 67, weight 130 lb (59 kg), unknown if currently breastfeeding.  Physical Exam Physical Exam General:   Alert and no distress  Skin:   no rash or abnormalities  Lungs:   clear to auscultation bilaterally  Heart:   regular rate and rhythm, S1, S2 normal, no murmur, click, rub or gallop  Breasts:   normal without suspicious masses, skin or nipple changes or axillary nodes  Abdomen:  normal findings: no organomegaly, soft, non-tender and no hernia  Pelvis:  External genitalia: normal general appearance Urinary system: urethral meatus normal and bladder without fullness, nontender Vaginal: normal without tenderness, induration or masses Cervix: normal appearance Adnexa: normal bimanual exam Uterus: anteverted and non-tender, normal size    I have spent a total of 20 minutes of face-to-face time, excluding clinical staff time, reviewing notes and preparing to see patient, ordering tests and/or medications, and counseling the patient.   Data Reviewed Labs  Assessment     1. SAB (spontaneous abortion), complete - doing` well physically  2. Grief associated with loss of fetus - appropriate grief - appointment scheduled with grief counselor      Plan    Follow up in 3 months for Annual / Pap    Brock Bad, MD 08/28/2021 3:15 PM

## 2021-09-03 ENCOUNTER — Ambulatory Visit (INDEPENDENT_AMBULATORY_CARE_PROVIDER_SITE_OTHER): Payer: 59 | Admitting: Licensed Clinical Social Worker

## 2021-09-03 DIAGNOSIS — F4321 Adjustment disorder with depressed mood: Secondary | ICD-10-CM

## 2021-09-04 NOTE — BH Specialist Note (Signed)
Integrated Behavioral Health via Telemedicine Visit  09/04/2021 Gwyndolyn Kaufman DAISHIA FETTERLY [phone]  Number of Integrated Behavioral Health visits: 1 Session Start time: 2:00pm  Session End time: 2:23pm Total time: 23 mins via mychart video  Referring Provider: Aron Baba MD Patient/Family location: Home  [facility] Provider location: [facility]  All persons participating in visit: Pt Deborah Bishop and LCSW A. Felton Clinton  Types of Service: General Behavioral Integrated Care (BHI)  I connected with Jelene Sheron Nightingale and/or Oval M Nehring's n/a via  Telephone or Video Enabled Telemedicine Application  (Video is Caregility application) and verified that I am speaking with the correct person using two identifiers. Discussed confidentiality: Yes   I discussed the limitations of telemedicine and the availability of in person appointments.  Discussed there is a possibility of technology failure and discussed alternative modes of communication if that failure occurs.  I discussed that engaging in this telemedicine visit, they consent to the provision of behavioral healthcare and the services will be billed under their insurance.  Patient and/or legal guardian expressed understanding and consented to Telemedicine visit: Yes   Presenting Concerns: Patient and/or family reports the following symptoms/concerns: depressed mood and guilt Duration of problem: approx one month ; Severity of problem: mild  Patient and/or Family's Strengths/Protective Factors: Concrete supports in place (healthy food, safe environments, etc.)  Goals Addressed: Patient will:  Reduce symptoms of: depression   Increase knowledge and/or ability of: coping skills   Demonstrate ability to: Begin healthy grieving over loss  Progress towards Goals: Ongoing  Interventions: Interventions utilized:  Supportive Counseling Standardized Assessments completed: Not Needed  Patient and/or Family Response: Ms. Huguley reports depressed mood and feelings of  guilt due to loss  in pregnancy. Ms. Messing reports she is tearful frequently does not feel comfortable talking about her experience with close family and friends due to lack of understanding.   Assessment: Patient currently experiencing grief associated with pregnancy loss .   Patient may benefit from integrated behavioral health.  Plan: Follow up with behavioral health clinician on : 3 weeks mychart  Behavioral recommendations: attend bh appts, express patience thru grieving process  Referral(s): Integrated [employer] (In Clinic)  I discussed the assessment and treatment plan with the patient and/or parent/guardian. They were provided an opportunity to ask questions and all were answered. They agreed with the plan and demonstrated an understanding of the instructions.   They were advised to call back or seek an in-person evaluation if the symptoms worsen or if the condition fails to improve as anticipated.  Gwyndolyn Saxon, LCSW

## 2021-09-24 ENCOUNTER — Encounter: Payer: 59 | Admitting: Licensed Clinical Social Worker

## 2021-11-27 ENCOUNTER — Ambulatory Visit: Payer: 59 | Admitting: Obstetrics

## 2022-01-06 ENCOUNTER — Other Ambulatory Visit: Payer: Self-pay

## 2022-01-06 ENCOUNTER — Other Ambulatory Visit (HOSPITAL_COMMUNITY)
Admission: RE | Admit: 2022-01-06 | Discharge: 2022-01-06 | Disposition: A | Discharge status: Home / Self Care | Payer: 59 | Source: Ambulatory Visit | Attending: Obstetrics | Admitting: Obstetrics

## 2022-01-06 ENCOUNTER — Encounter: Payer: Self-pay | Admitting: Obstetrics and Gynecology

## 2022-01-06 ENCOUNTER — Ambulatory Visit (INDEPENDENT_AMBULATORY_CARE_PROVIDER_SITE_OTHER): Payer: 59 | Admitting: Obstetrics and Gynecology

## 2022-01-06 DIAGNOSIS — Z01419 Encounter for gynecological examination (general) (routine) without abnormal findings: Secondary | ICD-10-CM | POA: Insufficient documentation

## 2022-01-06 NOTE — Patient Instructions (Signed)

## 2022-01-06 NOTE — Progress Notes (Signed)
Deborah Bishop is a 31 y.o. G36P1011 female here for a routine annual gynecologic exam.  Current complaints: None.   Denies abnormal vaginal bleeding, discharge, pelvic pain, problems with intercourse or other gynecologic concerns.    Gynecologic History Patient's last menstrual period was 12/23/2021 (exact date). Contraception: none Last Pap: uncertain. Results were: normal Last mammogram: NA.   Obstetric History OB History  Gravida Para Term Preterm AB Living  3 1 1  0 1 1  SAB IAB Ectopic Multiple Live Births  1 0 0 0 1    # Outcome Date GA Lbr Len/2nd Weight Sex Delivery Anes PTL Lv  3 Term 08/04/10   5 lb 1 oz (2.296 kg) F Vag-Spont   LIV  2 SAB           1 Gravida             Past Medical History:  Diagnosis Date   No pertinent past medical history     Past Surgical History:  Procedure Laterality Date   WISDOM TOOTH EXTRACTION  02/2021    Current Outpatient Medications on File Prior to Visit  Medication Sig Dispense Refill   Cetirizine HCl 10 MG CAPS Take 1 capsule (10 mg total) by mouth daily. 30 capsule 3   No current facility-administered medications on file prior to visit.    No Known Allergies  Social History   Socioeconomic History   Marital status: Single    Spouse name: Not on file   Number of children: Not on file   Years of education: Not on file   Highest education level: Not on file  Occupational History   Not on file  Tobacco Use   Smoking status: Former    Packs/day: 0.50    Types: Cigarettes   Smokeless tobacco: Never  Vaping Use   Vaping Use: Never used  Substance and Sexual Activity   Alcohol use: Not Currently    Comment: not since confirmed   Drug use: No   Sexual activity: Not Currently    Partners: Male    Birth control/protection: None    Comment: currently pregnanct  Other Topics Concern   Not on file  Social History Narrative   Not on file   Social Determinants of Health   Financial Resource Strain: Not on file  Food  Insecurity: Not on file  Transportation Needs: Not on file  Physical Activity: Not on file  Stress: Not on file  Social Connections: Not on file  Intimate Partner Violence: Not on file    Family History  Problem Relation Age of Onset   Stroke Father    Hypertension Father    03/2021' disease Father    Ovarian cancer Maternal Grandmother    Pancreatic cancer Maternal Grandfather    Heart failure Paternal Grandmother     The following portions of the patient's history were reviewed and updated as appropriate: allergies, current medications, past family history, past medical history, past social history, past surgical history and problem list.  Review of Systems Pertinent items noted in HPI and remainder of comprehensive ROS otherwise negative.   Objective:  BP 102/66    Pulse (!) 52    Ht 5\' 2"  (1.575 m)    Wt 129 lb 14.4 oz (58.9 kg)    LMP 12/23/2021 (Exact Date)    BMI 23.76 kg/m  CONSTITUTIONAL: Well-developed, well-nourished female in no acute distress.  HENT:  Normocephalic, atraumatic, External right and left ear normal. Oropharynx is clear and moist  EYES: Conjunctivae and EOM are normal. Pupils are equal, round, and reactive to light. No scleral icterus.  NECK: Normal range of motion, supple, no masses.  Normal thyroid.  SKIN: Skin is warm and dry. No rash noted. Not diaphoretic. No erythema. No pallor. NEUROLGIC: Alert and oriented to person, place, and time. Normal reflexes, muscle tone coordination. No cranial nerve deficit noted. PSYCHIATRIC: Normal mood and affect. Normal behavior. Normal judgment and thought content. CARDIOVASCULAR: Normal heart rate noted, regular rhythm RESPIRATORY: Clear to auscultation bilaterally. Effort and breath sounds normal, no problems with respiration noted. BREASTS: Symmetric in size. No masses, skin changes, nipple drainage, or lymphadenopathy. ABDOMEN: Soft, normal bowel sounds, no distention noted.  No tenderness, rebound or guarding.   PELVIC: Normal appearing external genitalia; normal appearing vaginal mucosa and cervix.  No abnormal discharge noted.  Pap smear obtained.  Normal uterine size, no other palpable masses, no uterine or adnexal tenderness. MUSCULOSKELETAL: Normal range of motion. No tenderness.  No cyanosis, clubbing, or edema.  2+ distal pulses.   Assessment:  Annual gynecologic examination with pap smear   Plan:  Will follow up results of pap smear and manage accordingly. Declined STD testing Routine preventative health maintenance measures emphasized. Please refer to After Visit Summary for other counseling recommendations.    Hermina Staggers, MD, FACOG Attending Obstetrician & Gynecologist Center for [facility], [facility]

## 2022-01-06 NOTE — Progress Notes (Signed)
Gyn/ annual Declines STI testing

## 2022-01-08 ENCOUNTER — Telehealth: Payer: Self-pay

## 2022-01-08 LAB — CYTOLOGY - PAP
Comment: NEGATIVE
Diagnosis: NEGATIVE
High risk HPV: NEGATIVE

## 2022-01-08 MED ORDER — METRONIDAZOLE 500 MG PO TABS: 2000.0000 mg | ORAL_TABLET | Freq: Once | ORAL | 0 refills | Status: AC

## 2022-01-08 NOTE — Telephone Encounter (Signed)
S/w pt and advised of results and rx sent to pharmacy. °

## 2023-02-20 IMAGING — US US OB < 14 WEEKS - US OB TV
1 series · 15 of 28 positions shown · non-contrast
Comparison: 07/18/2021

CLINICAL DATA: Dating and viability question twin pregnancy

EXAM:
OBSTETRIC <14 WK US AND TRANSVAGINAL OB US
TECHNIQUE: Both transabdominal and transvaginal ultrasound examinations were
performed for complete evaluation of the gestation as well as the
maternal uterus, adnexal regions, and pelvic cul-de-sac.
Transvaginal technique was performed to assess early pregnancy.

[Series 1: us ob < 14 weeks - us ob tv · 15 of 66 slices shown]
[im 1/66]
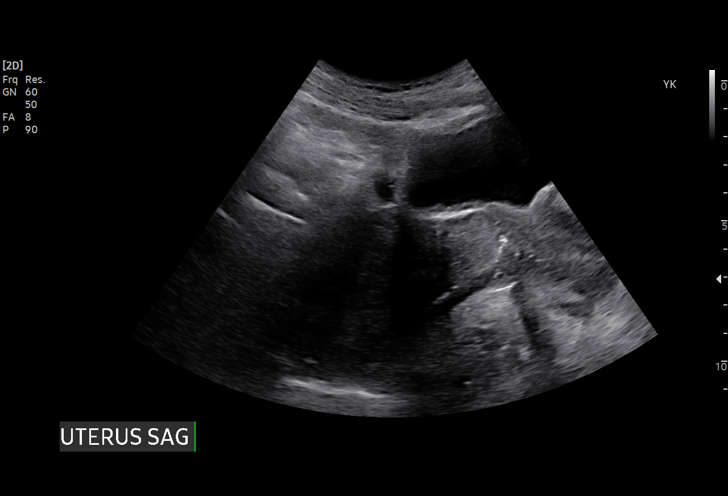
[im 5/66]
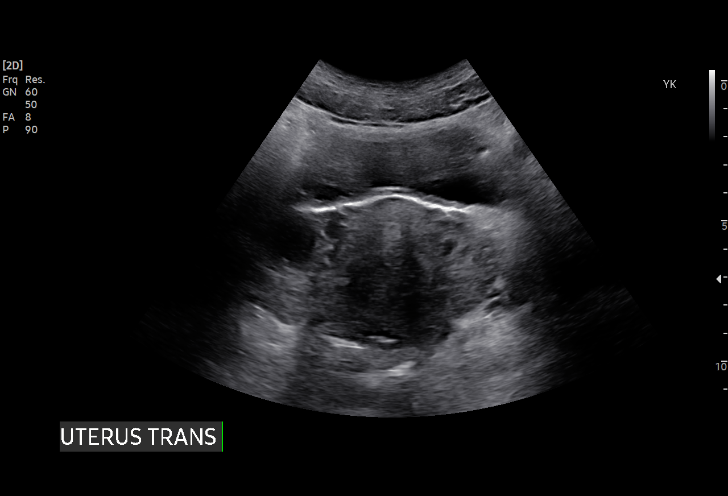
[im 10/66]
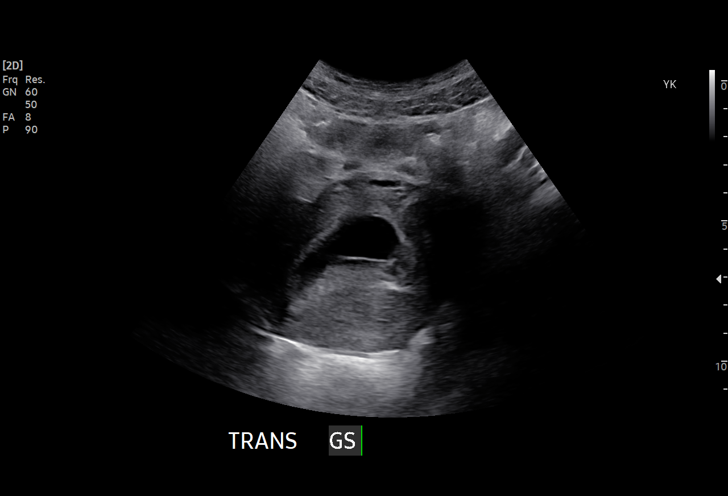
[im 15/66]
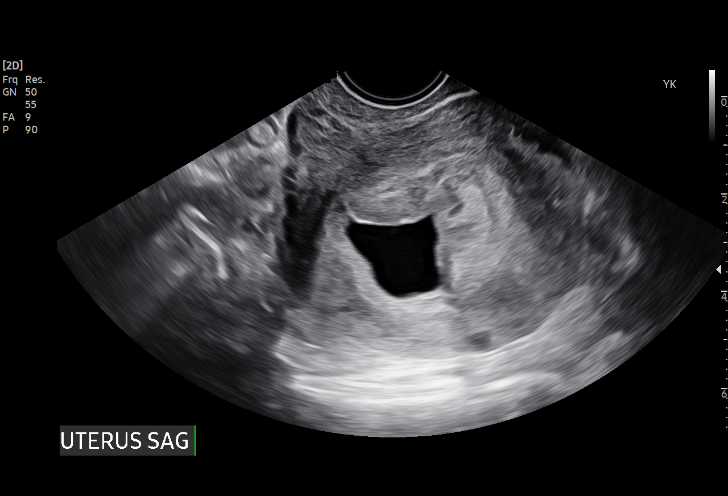
[im 20/66]
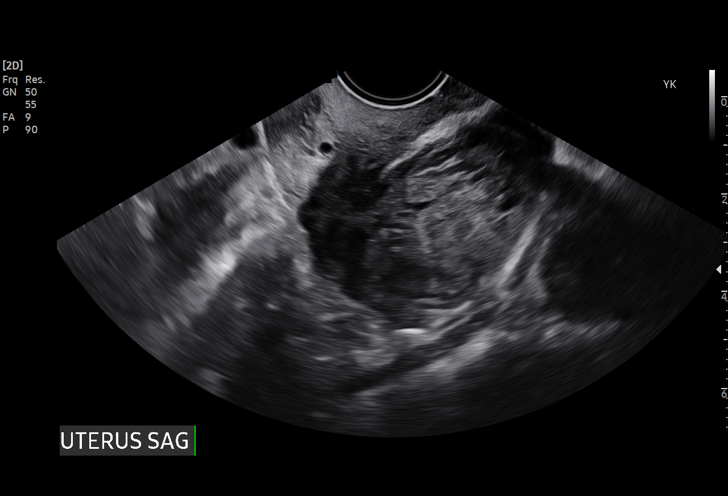
[im 25/66]
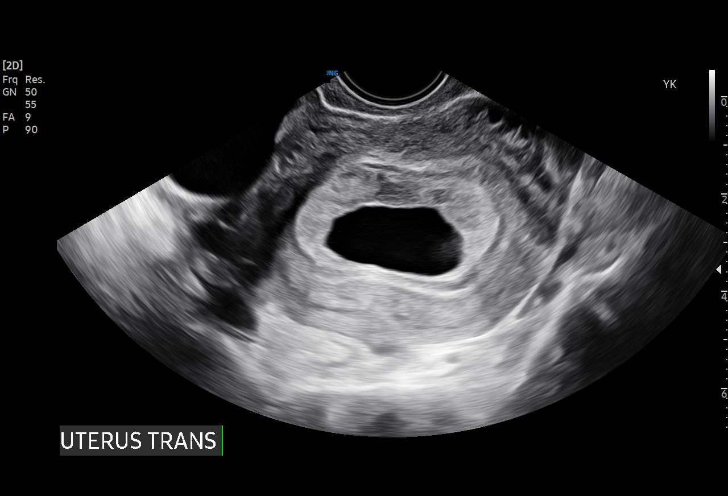
[im 29/66]
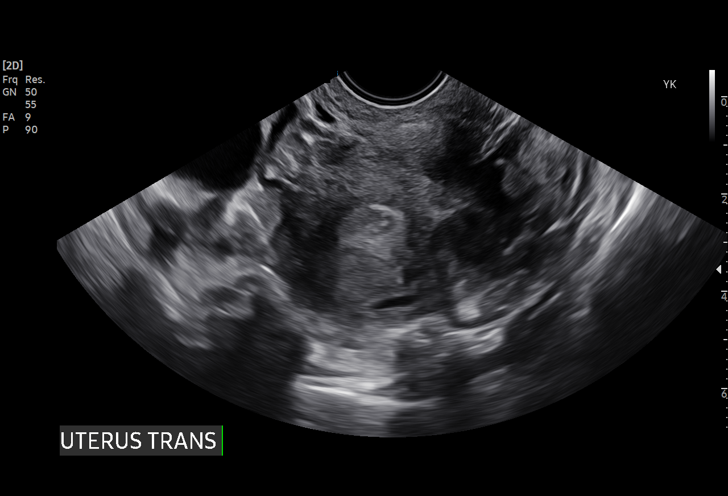
[im 34/66]
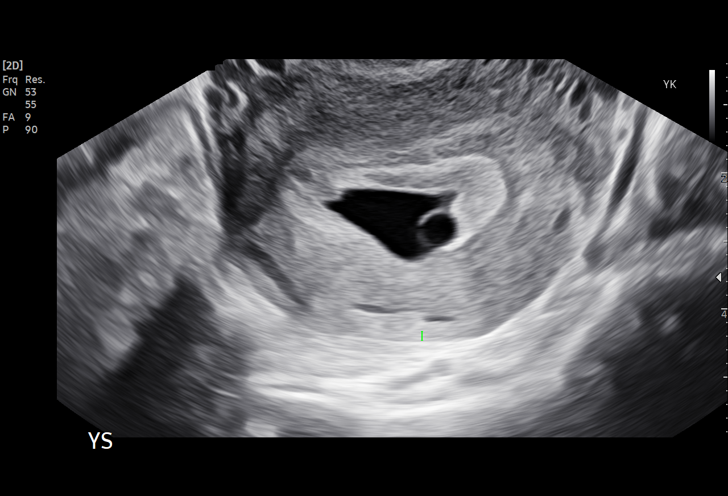
[im 37/66]
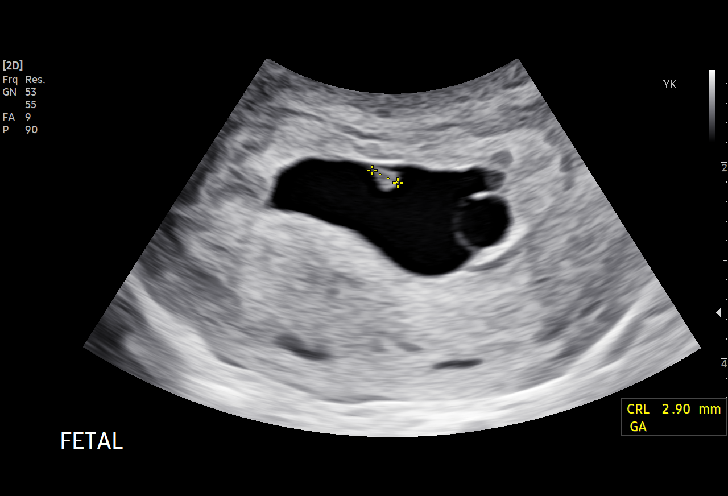
[im 41/66]
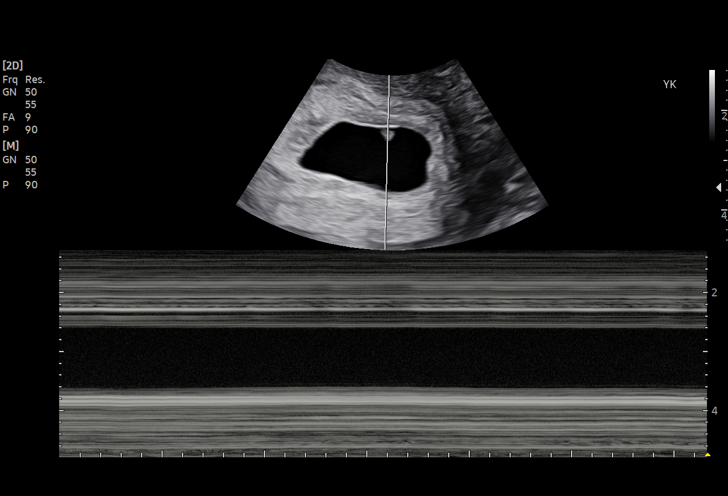
[im 46/66]
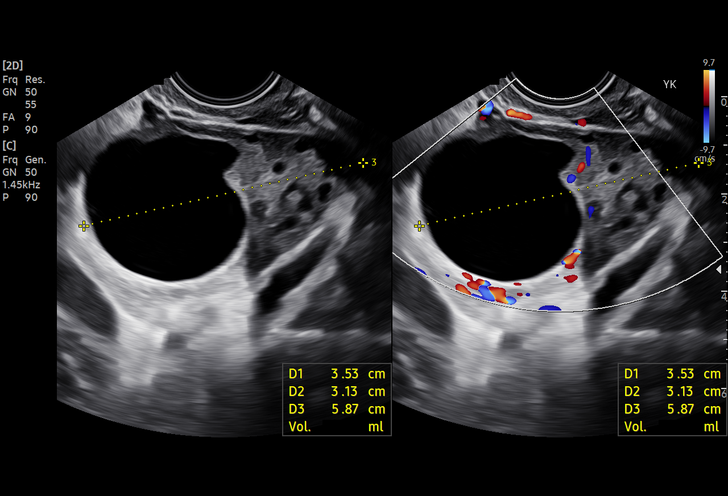
[im 51/66]
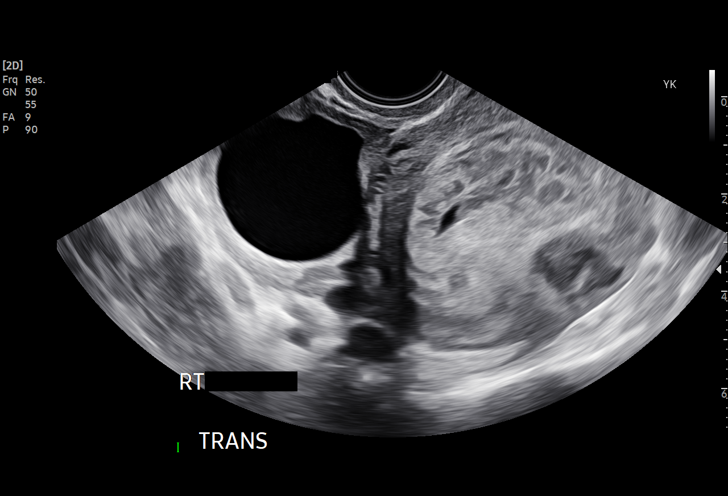
[im 56/66]
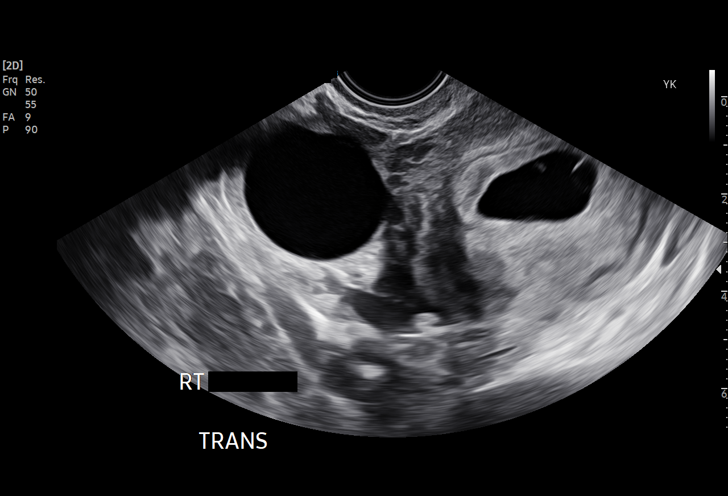
[im 61/66]
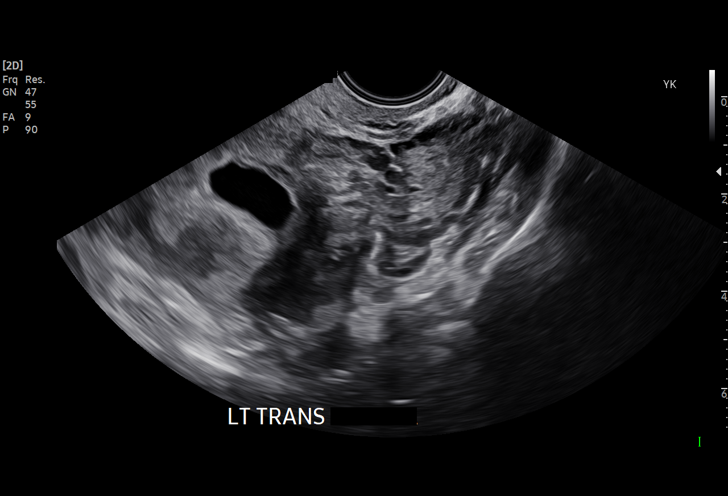
[im 66/66]
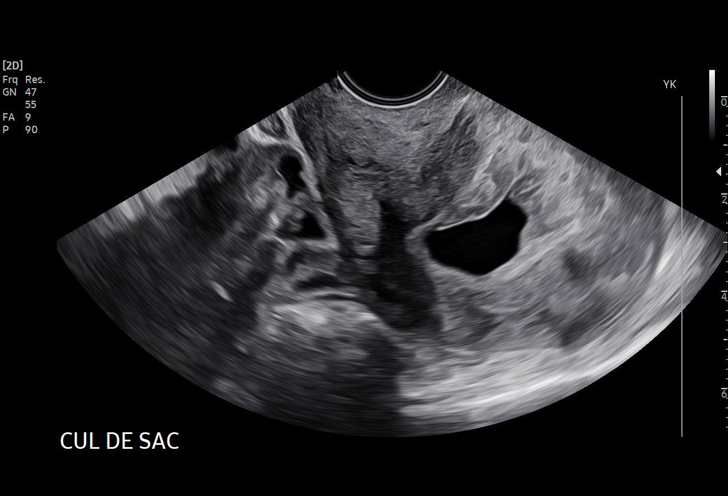

[15 of 28 positions shown; findings below may reference images not displayed]

FINDINGS: Intrauterine gestational sac: Present, single, slightly irregular

Yolk sac:  Present

Embryo:  Present

Cardiac Activity: Not identified

Heart Rate: N/A  bpm

MSD: 17.7 mm   6 w   5 d

CRL:  2.9 mm   5 w   6 d                  US EDC: 03/28/2022

Subchorionic hemorrhage:  None visualized.

Maternal uterus/adnexae:

Uterus retroverted, otherwise unremarkable.

RIGHT ovary measures 3.5 x 3.1 x 5.9 cm and contains a 3.4 cm
diameter corpus luteal cyst.

LEFT ovary normal size and morphology 3.6 x 2.2 x 2.7 cm.

No free pelvic fluid or adnexal masses.
IMPRESSION: Single intrauterine gestation identified.

No fetal cardiac activity identified.

Absent growth of fetal pole since the prior exam 14 days ago.

Findings are consistent with failed pregnancy.

## 2024-07-08 ENCOUNTER — Ambulatory Visit (INDEPENDENT_AMBULATORY_CARE_PROVIDER_SITE_OTHER): Payer: Self-pay | Admitting: Certified Nurse Midwife

## 2024-07-08 ENCOUNTER — Encounter: Payer: Self-pay | Admitting: Certified Nurse Midwife

## 2024-07-08 ENCOUNTER — Other Ambulatory Visit (HOSPITAL_COMMUNITY)
Admission: RE | Admit: 2024-07-08 | Discharge: 2024-07-08 | Disposition: A | Discharge status: Home / Self Care | Source: Ambulatory Visit | Attending: Certified Nurse Midwife | Admitting: Certified Nurse Midwife

## 2024-07-08 VITALS — BP 137/85 | HR 97 | Ht 62.5 in | Wt 136.2 lb

## 2024-07-08 DIAGNOSIS — Z113 Encounter for screening for infections with a predominantly sexual mode of transmission: Secondary | ICD-10-CM

## 2024-07-08 DIAGNOSIS — N926 Irregular menstruation, unspecified: Secondary | ICD-10-CM

## 2024-07-08 DIAGNOSIS — Z3009 Encounter for other general counseling and advice on contraception: Secondary | ICD-10-CM

## 2024-07-08 DIAGNOSIS — Z01419 Encounter for gynecological examination (general) (routine) without abnormal findings: Secondary | ICD-10-CM

## 2024-07-08 NOTE — Progress Notes (Signed)
 Pt presents for AEX. Does not want PAP today. Pt has concerns about last period being irregular. Negative UPT at home x5  Requesting STD testing. Refused self swab

## 2024-07-09 NOTE — Progress Notes (Signed)
   GYNECOLOGY OFFICE VISIT NOTE  History:   Deborah Bishop is a 33 y.o. G3P1011 here today for Annual Well Women Exam. She reports that her last period was 3 days late and lasted only one day in duration. She states that she spotted for 3 days but no other symptoms. Denies any recent life changes  She denies any abnormal vaginal discharge, bleeding, pelvic pain or other concerns.  She is requesting STD testing. Declined blood testing.     Past Medical History:  Diagnosis Date   No pertinent past medical history     Past Surgical History:  Procedure Laterality Date   WISDOM TOOTH EXTRACTION  02/2021    The following portions of the patient's history were reviewed and updated as appropriate: allergies, current medications, past family history, past medical history, past social history, past surgical history and problem list.   Health Maintenance:  Normal pap and negative HRHPV on 01/06/22.  Review of Systems:  Pertinent items noted in HPI and remainder of comprehensive ROS otherwise negative.  Physical Exam:  BP 137/85   Pulse 97   Ht 5' 2.5 (1.588 m)   Wt 136 lb 3.2 oz (61.8 kg)   LMP 06/24/2024   BMI 24.51 kg/m  CONSTITUTIONAL: Well-developed, well-nourished female in no acute distress.  HEENT:  Normocephalic, atraumatic. External right and left ear normal. No scleral icterus.  NECK: Normal range of motion, supple, no masses noted on observation SKIN: No rash noted. Not diaphoretic. No erythema. No pallor. MUSCULOSKELETAL: Normal range of motion. No edema noted. NEUROLOGIC: Alert and oriented to person, place, and time. Normal muscle tone coordination. No cranial nerve deficit noted. PSYCHIATRIC: Normal mood and affect. Normal behavior. Normal judgment and thought content. CARDIOVASCULAR: Normal heart rate noted RESPIRATORY: Effort and breath sounds normal, no problems with respiration noted ABDOMEN: No masses noted. No other overt distention noted.   PELVIC: Normal appearing  external genitalia; normal urethral meatus; normal appearing vaginal mucosa. Performed in the presence of a chaperone  Labs and Imaging No results found for this or any previous visit (from the past week). No results found.    Assessment and Plan:    1. Encounter for annual routine gynecological examination (Primary) - Patient doing well.   2. Birth control counseling - Patient declined Birth Control at this time.   3. Screening examination for STD (sexually transmitted disease) - CNM offered to self collect STD testing since Pap was Up to date and patient declined. Testing performed at patient request. Chaperone present for the duration of exam.  - Cervicovaginal ancillary only( [place])  4. Irregular periods - Negative UPT in office.  - Reviewed that is likely a one time occurrence. Recommended to continue to monitor.    Routine preventative health maintenance measures emphasized. Please refer to After Visit Summary for other counseling recommendations.   Return in about 1 year (around 07/08/2025) for [place].    I spent 30 minutes dedicated to the care of this patient including pre-visit review of records, face to face time with the patient discussing her conditions and treatments and post visit orders.   Deborah Bishop) Emilio, MSN, CNM  Center for [facility]  07/09/24 11:58 AM

## 2024-07-11 LAB — CERVICOVAGINAL ANCILLARY ONLY
Bacterial Vaginitis (gardnerella): POSITIVE — AB
Candida Glabrata: NEGATIVE
Candida Vaginitis: NEGATIVE
Chlamydia: NEGATIVE
Comment: NEGATIVE
Comment: NEGATIVE
Comment: NEGATIVE
Comment: NEGATIVE
Comment: NEGATIVE
Comment: NORMAL
Neisseria Gonorrhea: NEGATIVE
Trichomonas: NEGATIVE

## 2024-07-12 ENCOUNTER — Other Ambulatory Visit: Payer: Self-pay

## 2024-07-12 DIAGNOSIS — B9689 Other specified bacterial agents as the cause of diseases classified elsewhere: Secondary | ICD-10-CM

## 2024-07-12 MED ORDER — METRONIDAZOLE 500 MG PO TABS: 500.0000 mg | ORAL_TABLET | Freq: Two times a day (BID) | ORAL | 0 refills | Status: AC

## 2024-07-13 ENCOUNTER — Ambulatory Visit: Payer: Self-pay | Admitting: Certified Nurse Midwife
